# Patient Record
Sex: Male | Born: 1980 | Race: White | Hispanic: No | Marital: Married | Smoking: Former smoker
Health system: Southern US, Community
[De-identification: ages and names within clinical notes are randomized; demographics above are authoritative.]

## PROBLEM LIST (undated history)

## (undated) DIAGNOSIS — N390 Urinary tract infection, site not specified: Secondary | ICD-10-CM

## (undated) DIAGNOSIS — J45909 Unspecified asthma, uncomplicated: Secondary | ICD-10-CM

## (undated) DIAGNOSIS — E119 Type 2 diabetes mellitus without complications: Secondary | ICD-10-CM

## (undated) DIAGNOSIS — I10 Essential (primary) hypertension: Secondary | ICD-10-CM

## (undated) DIAGNOSIS — E785 Hyperlipidemia, unspecified: Secondary | ICD-10-CM

## (undated) HISTORY — DX: Type 2 diabetes mellitus without complications: E11.9

## (undated) HISTORY — DX: Unspecified asthma, uncomplicated: J45.909

## (undated) HISTORY — DX: Essential (primary) hypertension: I10

## (undated) HISTORY — DX: Hyperlipidemia, unspecified: E78.5

---

## 2008-06-18 ENCOUNTER — Ambulatory Visit: Admission: AD | Admit: 2008-06-18 | Discharge: 2008-06-18 | Payer: Self-pay | Admitting: Family Medicine

## 2009-03-06 ENCOUNTER — Encounter: Payer: Self-pay | Admitting: Orthopedic Surgery

## 2009-03-06 ENCOUNTER — Emergency Department (HOSPITAL_COMMUNITY): Admission: EM | Admit: 2009-03-06 | Discharge: 2009-03-06 | Payer: Self-pay | Admitting: Emergency Medicine

## 2009-03-07 ENCOUNTER — Ambulatory Visit: Payer: Self-pay | Admitting: Orthopedic Surgery

## 2009-03-07 DIAGNOSIS — S62009A Unspecified fracture of navicular [scaphoid] bone of unspecified wrist, initial encounter for closed fracture: Secondary | ICD-10-CM | POA: Insufficient documentation

## 2009-03-28 ENCOUNTER — Ambulatory Visit: Payer: Self-pay | Admitting: Orthopedic Surgery
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
## 2010-03-28 NOTE — Letter (Signed)
Summary: Work [facility]  [address]  [place], [place] [address]   Phone: [phone]  Fax: [phone]      Today's Date: March 07, 2009  Name of Patient: Tony Wilson  The above named patient had a medical visit today at: 2:30 pm.  Please take this into consideration when reviewing the time away from work/school.    Special Instructions:   [  ] To be off the remainder of today, returning to the work tomorrow, 03/08/2009, with the following restrictions:     - Please accomodate/adjust work duties accordingly, secondary to cast for the next month (through next scheduled appointment:  04/07/09)      Sincerely,   Terrance Mass, MD

## 2010-03-28 NOTE — Letter (Signed)
Summary: History form  History form   Imported By: Jacklynn Ganong 03/14/2009 13:48:17  _____________________________________________________________________  External Attachment:    Type:   Image     Comment:   External Document

## 2011-06-08 ENCOUNTER — Emergency Department (HOSPITAL_COMMUNITY): Payer: BC Managed Care – PPO

## 2011-06-08 ENCOUNTER — Encounter (HOSPITAL_COMMUNITY): Payer: Self-pay | Admitting: Emergency Medicine

## 2011-06-08 ENCOUNTER — Emergency Department (HOSPITAL_COMMUNITY)
Admission: EM | Admit: 2011-06-08 | Discharge: 2011-06-08 | Disposition: A | Discharge status: Home / Self Care | Payer: BC Managed Care – PPO | Attending: Emergency Medicine | Admitting: Emergency Medicine

## 2011-06-08 DIAGNOSIS — F17200 Nicotine dependence, unspecified, uncomplicated: Secondary | ICD-10-CM | POA: Insufficient documentation

## 2011-06-08 DIAGNOSIS — Z23 Encounter for immunization: Secondary | ICD-10-CM | POA: Insufficient documentation

## 2011-06-08 DIAGNOSIS — R079 Chest pain, unspecified: Secondary | ICD-10-CM | POA: Insufficient documentation

## 2011-06-08 DIAGNOSIS — M25559 Pain in unspecified hip: Secondary | ICD-10-CM | POA: Insufficient documentation

## 2011-06-08 DIAGNOSIS — T07XXXA Unspecified multiple injuries, initial encounter: Secondary | ICD-10-CM

## 2011-06-08 DIAGNOSIS — IMO0002 Reserved for concepts with insufficient information to code with codable children: Secondary | ICD-10-CM | POA: Insufficient documentation

## 2011-06-08 DIAGNOSIS — S7000XA Contusion of unspecified hip, initial encounter: Secondary | ICD-10-CM | POA: Insufficient documentation

## 2011-06-08 DIAGNOSIS — R21 Rash and other nonspecific skin eruption: Secondary | ICD-10-CM | POA: Insufficient documentation

## 2011-06-08 MED ORDER — HYDROCODONE-ACETAMINOPHEN 5-325 MG PO TABS
1.0000 | ORAL_TABLET | Freq: Once | ORAL | Status: AC
  Administered 2011-06-08: 1 via ORAL
  Filled 2011-06-08: qty 1

## 2011-06-08 MED ORDER — TETANUS-DIPHTH-ACELL PERTUSSIS 5-2.5-18.5 LF-MCG/0.5 IM SUSP
0.5000 mL | Freq: Once | INTRAMUSCULAR | Status: AC
  Administered 2011-06-08: 0.5 mL via INTRAMUSCULAR
  Filled 2011-06-08: qty 0.5

## 2011-06-08 MED ORDER — HYDROCODONE-ACETAMINOPHEN 5-325 MG PO TABS: 1.0000 | ORAL_TABLET | Freq: Four times a day (QID) | ORAL | Status: AC | PRN
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
## 2011-06-08 NOTE — ED Notes (Signed)
Pt was seen by md,

## 2011-06-08 NOTE — Discharge Instructions (Signed)
Wound treatment as discussed.wash daily in the shower dress wounds with Neosporin or bacitracin ointment. Recommend taking Motrin or Naprosyn daily and supplement with hydrocodone prescription given for more severe pain. Return for any abdominal pain nausea or vomiting or trouble breathing. X-rays today were all negative.  RESOURCE GUIDE  Dental Problems  Patients with Medicaid: [facility] [phone] [address]                                           [phone] Phone:  [phone]                                                  Phone:  [phone]  If unable to pay or uninsured, contact:  [facility] or [facility] to become qualified for the adult dental clinic.  Chronic Pain Problems Contact [facility]  [phone] Patients need to be referred by their primary care doctor.  Insufficient Money for Medicine Contact [employer] Way:  call "211" or [facility] 8702826930.  No Primary Care Doctor Call [facility]  3122078715 Other agencies that provide inexpensive medical care    [facility]  [phone]    [facility]  [phone]    [facility]  [phone]    [facility]  [phone]    [facility]  [phone]    [facility]  [phone]  Psychological Services [facility]  [phone] [facility]  [phone] [facility]   [phone] (emergency services [phone])  Substance Abuse Resources [facility]  7254865255 [facility] [phone] [facility] [phone] Floydene Flock [phone] Residential & Outpatient Substance Abuse Program  3132952755  Abuse/Neglect [facility] Child Abuse Hotline 506-438-9461 [facility] Child Abuse Hotline [phone] (After Hours)  Emergency Shelter [facility] [phone]  Maternity  Homes Room at the [place] [facility] [place] [phone] [facility] [phone]  MRSA Hotline #:   930-799-5398    [facility]  [facility]     [employer]                          [facility] Dept. [address] [place]                       [address]      [address] [place] [address]  [place]                                                Cristobal Goldmann Phone:  [phone]  Phone:  [phone]                 Phone:  [phone]  [place] [facility] Phone:  [phone]  [place] Child Abuse Hotline (336) 342-1394 [phone] (After Hours)   
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
## 2011-06-14 ENCOUNTER — Other Ambulatory Visit (HOSPITAL_COMMUNITY): Payer: Self-pay | Admitting: Family Medicine

## 2011-06-14 ENCOUNTER — Ambulatory Visit (HOSPITAL_COMMUNITY)
Admission: RE | Admit: 2011-06-14 | Discharge: 2011-06-14 | Disposition: A | Discharge status: Home / Self Care | Payer: BC Managed Care – PPO | Source: Ambulatory Visit | Attending: Family Medicine | Admitting: Family Medicine

## 2011-06-14 DIAGNOSIS — M25539 Pain in unspecified wrist: Secondary | ICD-10-CM | POA: Insufficient documentation

## 2012-06-09 ENCOUNTER — Ambulatory Visit (HOSPITAL_COMMUNITY)
Admission: RE | Admit: 2012-06-09 | Discharge: 2012-06-09 | Disposition: A | Discharge status: Home / Self Care | Payer: 59 | Source: Ambulatory Visit | Attending: Family Medicine | Admitting: Family Medicine

## 2012-06-09 DIAGNOSIS — M545 Low back pain, unspecified: Secondary | ICD-10-CM | POA: Insufficient documentation

## 2012-06-09 DIAGNOSIS — M6281 Muscle weakness (generalized): Secondary | ICD-10-CM | POA: Insufficient documentation

## 2012-06-09 DIAGNOSIS — IMO0001 Reserved for inherently not codable concepts without codable children: Secondary | ICD-10-CM | POA: Insufficient documentation
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
## 2013-12-14 ENCOUNTER — Ambulatory Visit (INDEPENDENT_AMBULATORY_CARE_PROVIDER_SITE_OTHER): Payer: 59 | Admitting: Family Medicine

## 2013-12-14 ENCOUNTER — Ambulatory Visit (INDEPENDENT_AMBULATORY_CARE_PROVIDER_SITE_OTHER): Payer: 59

## 2013-12-14 VITALS — BP 148/90 | HR 78 | Temp 97.7°F | Resp 16 | Ht 73.25 in | Wt 253.0 lb

## 2013-12-14 DIAGNOSIS — M25562 Pain in left knee: Secondary | ICD-10-CM

## 2013-12-14 MED ORDER — MELOXICAM 15 MG PO TABS: 15.0000 mg | ORAL_TABLET | Freq: Every day | ORAL | Status: DC

## 2013-12-14 NOTE — Patient Instructions (Addendum)
You will be called to schedule your MRI and an appointment with ortho. You may take mobic once daily for pain and inflammation Wear the knee brace during the day to help with stabilization.

## 2013-12-14 NOTE — Progress Notes (Signed)
Subjective:    Patient ID: Tony SatoKeith E Salah, male    DOB: Feb 26, 1981, 33 y.o.   MRN: [id number]  Knee Pain  Pertinent negatives include no numbness.    This is a 33 year old male with only PMH lumbago for which he takes vicodin daily. He is here with 5 months of left knee pain that has worsened over the past 2 months. He does not recall an injury. The pain is located to the lateral side. He describes the pain as sharp and radiates halfway down the lateral side of his leg. The pain is worse with flexion and better with extension. The pain is especially bad with going up stairs. He is limping and walking by swinging out his left leg. He has not tried anything for the pain other than his daily vicodin for back pain which has not helped his knee pain at all. 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 He has not ridden motorcycles since this pain started 5 months ago. He denies knee swelling or paresthesias.  Patient's BP is elevated today. He reports he takes his BP regularly at home and usually 120-130/75-85.   Review of Systems  Musculoskeletal: Positive for arthralgias, back pain, gait problem and myalgias. Negative for joint swelling.  Skin: Negative.   Neurological: Positive for weakness. Negative for numbness.       Objective:   Physical Exam  Constitutional: He is oriented to person, place, and time. He appears well-developed and well-nourished. No distress.  HENT:  Head: Normocephalic and atraumatic.  Right Ear: Hearing normal.  Left Ear: Hearing normal.  Eyes: Conjunctivae are normal. Right eye exhibits no discharge. Left eye exhibits no discharge. No scleral icterus.  Cardiovascular: Normal rate, regular rhythm, normal heart sounds and intact distal pulses.   Pulmonary/Chest: Effort normal and breath sounds normal. No respiratory distress.  Musculoskeletal:       Left knee: He exhibits normal range of motion, no effusion, no ecchymosis, no  erythema, no LCL laxity, no bony tenderness and normal meniscus. No medial joint line, no lateral joint line, no MCL, no LCL and no patellar tendon tenderness noted.  Pain at the lateral knee with flexion and internal rotation. No posterior knee pain. McMurray negative. Lachman negative.  Neurological: He is alert and oriented to person, place, and time. He has normal reflexes. No sensory deficit.  Reflex Scores:      Bicep reflexes are 2+ on the right side and 2+ on the left side.      Achilles reflexes are 2+ on the right side and 2+ on the left side. Left knee strength: 4/5 extension, 5/5 flexion Left ankle strength: 4/5 dorsiflexion, 4+/5 plantar flexion Right knee strength: 5/5 throughout Right ankle strength: 5/5 throughout  Skin: Skin is warm, dry and intact. No lesion and no rash noted.  Psychiatric: He has a normal mood and affect. His speech is normal and behavior is normal. Thought content normal.   [facility] reading (PRIMARY) by  Dr. Conley RollsLe: negative.     Assessment & Plan:  1. Knee pain, acute, left  Etiology of knee pain unclear - LCL vs. Hamstring vs. Gastrocnemius. Was unable to elicit any point tenderness. He had exquisite pain with flexion and internal rotation of the knee. Xray of the left knee was negative. Due to this going on for 5 months, his gait being affected, and decreased strength, will send for an MRI of the left knee and a  referral to ortho. He will take mobic daily for pain and inflammation. He was fitted for a hinge knee brace today.   - DG Knee Complete 4 Views Left; Future - MR Knee Left  Wo Contrast; Future - Ambulatory referral to Orthopedic Surgery - meloxicam (MOBIC) 15 MG tablet; Take 1 tablet (15 mg total) by mouth daily.  Dispense: 30 tablet; Refill: 0 - PR KO ELAS W/ CONDYLE PADS & Arie SabinaJO   Montie Swiderski V. Dyke BrackettBush, PA-C, MHS [facility]  12/14/2013

## 2013-12-16 NOTE — Progress Notes (Signed)
A/P d/w Ms Dyke BrackettBush PA-C. I am in agreement with A/P. Dr Conley RollsLe

## 2013-12-17 ENCOUNTER — Other Ambulatory Visit: Payer: Self-pay | Admitting: Family Medicine

## 2013-12-17 DIAGNOSIS — M25562 Pain in left knee: Secondary | ICD-10-CM

## 2013-12-21 ENCOUNTER — Ambulatory Visit (HOSPITAL_COMMUNITY): Payer: 59

## 2013-12-24 ENCOUNTER — Ambulatory Visit (HOSPITAL_COMMUNITY)
Admission: RE | Admit: 2013-12-24 | Discharge: 2013-12-24 | Disposition: A | Discharge status: Home / Self Care | Payer: 59 | Source: Ambulatory Visit | Attending: Family Medicine | Admitting: Family Medicine

## 2013-12-24 DIAGNOSIS — M25462 Effusion, left knee: Secondary | ICD-10-CM | POA: Insufficient documentation

## 2013-12-24 DIAGNOSIS — M94262 Chondromalacia, left knee: Secondary | ICD-10-CM | POA: Insufficient documentation

## 2013-12-24 DIAGNOSIS — M25562 Pain in left knee: Secondary | ICD-10-CM | POA: Insufficient documentation

## 2013-12-28 ENCOUNTER — Encounter: Payer: Self-pay | Admitting: Family Medicine

## 2013-12-28 ENCOUNTER — Telehealth: Payer: Self-pay | Admitting: Family Medicine

## 2013-12-28 NOTE — Telephone Encounter (Signed)
LM about MRI results. He has a referral to Dr Althea CharonMcKinley

## 2014-01-12 ENCOUNTER — Encounter: Payer: Self-pay | Admitting: Orthopedic Surgery

## 2014-01-12 ENCOUNTER — Ambulatory Visit (INDEPENDENT_AMBULATORY_CARE_PROVIDER_SITE_OTHER): Payer: 59 | Admitting: Orthopedic Surgery

## 2014-01-12 VITALS — BP 128/88 | Ht 73.25 in | Wt 253.0 lb

## 2014-01-12 DIAGNOSIS — M25562 Pain in left knee: Secondary | ICD-10-CM | POA: Insufficient documentation

## 2014-01-12 MED ORDER — GABAPENTIN 100 MG PO CAPS: 100.0000 mg | ORAL_CAPSULE | Freq: Three times a day (TID) | ORAL | Status: AC

## 2014-01-12 NOTE — Progress Notes (Signed)
Patient ID: Tony SatoKeith E Reppond, male   DOB: 1980-06-25, 33 y.o.   MRN: [id number] Chief Complaint  Patient presents with  . Knee Pain    left knee pain, no known injury    33 year old male presents with atypical knee pain. He's had an x-ray and an MRI. I looked at his MRI had some fluid deep to the iliotibial band which was read as possible related to iliotibial band syndrome. 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  His symptoms are not typical for iliotibial band syndrome  He said he was coming down some steps with a bag of concrete felt acute pain couldn't bend his knee.  His pain is lateral unrelated to his patellofemoral joint in any way.  He has a history of chronic degenerative disc disease was seen by neurosurgery several years ago surgery was not done at that time  His complaints now are stiffness giving out aching lateral posterior thigh distally and posterior knee and proximal lateral tib-fib area. He rates it 5-6 out of 10 and says his medication for this was meloxicam and he stopped taking that because of the side effects listed on the bottle.  He has no allergies. His family history is negative. He does not use drugs drink or smoke  There is no medical history listed for him.  BP 128/88 mmHg  Ht 6' 1.25" (1.861 m)  Wt 253 lb (114.76 kg)  BMI 33.14 kg/m2  His appearance is normal He is oriented 3 His mood and affect is normal He walks normally Right knee no tenderness full range of motion ligament stable motor exam normal skin intact normal sensation  Left knee no effusion no tenderness ligament stable motor exam normal skin intact good pulses normal sensation negative McMurray sign  He is tender in the posterior lateral distal thigh and this can be  palpated along the peroneal nerve  He has a typical knee pain I think his problems are related to either his lumbar spine or some type of radicular neuralgia  I recommend he wear knee brace in case there is any problems related to his chondromalacia and take gabapentin 100 mg 3 times a day #60 and follow-up with his primary care physician

## 2014-01-25 ENCOUNTER — Other Ambulatory Visit (HOSPITAL_COMMUNITY): Payer: Self-pay | Admitting: Physician Assistant

## 2014-01-25 DIAGNOSIS — M5136 Other intervertebral disc degeneration, lumbar region: Secondary | ICD-10-CM

## 2014-01-25 DIAGNOSIS — M5417 Radiculopathy, lumbosacral region: Secondary | ICD-10-CM

## 2014-01-25 DIAGNOSIS — G894 Chronic pain syndrome: Secondary | ICD-10-CM

## 2014-01-28 ENCOUNTER — Ambulatory Visit (HOSPITAL_COMMUNITY)
Admission: RE | Admit: 2014-01-28 | Discharge: 2014-01-28 | Disposition: A | Discharge status: Home / Self Care | Payer: 59 | Source: Ambulatory Visit | Attending: Physician Assistant | Admitting: Physician Assistant

## 2014-01-28 DIAGNOSIS — G894 Chronic pain syndrome: Secondary | ICD-10-CM

## 2014-01-28 DIAGNOSIS — M25552 Pain in left hip: Secondary | ICD-10-CM | POA: Diagnosis not present

## 2014-01-28 DIAGNOSIS — M545 Low back pain: Secondary | ICD-10-CM | POA: Diagnosis present

## 2014-01-28 DIAGNOSIS — M479 Spondylosis, unspecified: Secondary | ICD-10-CM | POA: Diagnosis not present

## 2014-01-28 DIAGNOSIS — M5417 Radiculopathy, lumbosacral region: Secondary | ICD-10-CM

## 2014-01-28 DIAGNOSIS — M5136 Other intervertebral disc degeneration, lumbar region: Secondary | ICD-10-CM | POA: Insufficient documentation

## 2015-03-16 MED FILL — HYDROCODON-APAP 10-325: 10-325 | 25 days supply | Qty: 100 | Fill #0

## 2015-03-16 MED FILL — diazePAM 10 MG TABS: 10 | 25 days supply | Qty: 100 | Fill #1

## 2015-04-20 DIAGNOSIS — A599 Trichomoniasis, unspecified: Secondary | ICD-10-CM | POA: Diagnosis not present

## 2015-04-20 DIAGNOSIS — Z6832 Body mass index (BMI) 32.0-32.9, adult: Secondary | ICD-10-CM | POA: Diagnosis not present

## 2015-04-20 DIAGNOSIS — G894 Chronic pain syndrome: Secondary | ICD-10-CM | POA: Diagnosis not present

## 2015-04-20 DIAGNOSIS — E6609 Other obesity due to excess calories: Secondary | ICD-10-CM | POA: Diagnosis not present

## 2015-04-20 DIAGNOSIS — F419 Anxiety disorder, unspecified: Secondary | ICD-10-CM | POA: Diagnosis not present

## 2015-04-20 DIAGNOSIS — Z1389 Encounter for screening for other disorder: Secondary | ICD-10-CM | POA: Diagnosis not present

## 2015-04-20 MED FILL — metroNIDAZOLE 500 MG TABS: 500 | 1 days supply | Qty: 4 | Fill #0

## 2015-04-21 MED FILL — diazePAM 10 MG TABS: 10 | 25 days supply | Qty: 100 | Fill #2

## 2015-04-21 MED FILL — HYDROCODON-APAP 10-325: 10-325 | 25 days supply | Qty: 100 | Fill #0

## 2015-05-31 MED FILL — HYDROCODON-APAP 10-325: 10-325 | 30 days supply | Qty: 100 | Fill #0

## 2015-06-23 MED FILL — GABAPENTIN 100 MG CAPSULE: 100 | 90 days supply | Qty: 270 | Fill #1

## 2015-07-29 MED FILL — diazePAM 10 MG TABS: 10 | 25 days supply | Qty: 100 | Fill #0

## 2015-07-29 MED FILL — HYDROCODON-APAP 10-325: 10-325 | 30 days supply | Qty: 120 | Fill #0

## 2015-08-01 DIAGNOSIS — H52223 Regular astigmatism, bilateral: Secondary | ICD-10-CM | POA: Diagnosis not present

## 2015-08-01 DIAGNOSIS — H5213 Myopia, bilateral: Secondary | ICD-10-CM | POA: Diagnosis not present

## 2015-08-03 DIAGNOSIS — F419 Anxiety disorder, unspecified: Secondary | ICD-10-CM | POA: Diagnosis not present

## 2015-08-03 DIAGNOSIS — Z1389 Encounter for screening for other disorder: Secondary | ICD-10-CM | POA: Diagnosis not present

## 2015-08-03 DIAGNOSIS — G894 Chronic pain syndrome: Secondary | ICD-10-CM | POA: Diagnosis not present

## 2015-08-03 DIAGNOSIS — Z6832 Body mass index (BMI) 32.0-32.9, adult: Secondary | ICD-10-CM | POA: Diagnosis not present

## 2015-08-03 DIAGNOSIS — L989 Disorder of the skin and subcutaneous tissue, unspecified: Secondary | ICD-10-CM | POA: Diagnosis not present

## 2015-08-03 MED FILL — CHANTIX STARTING MONTH BOX: 0.5 MG X 11 | 28 days supply | Qty: 53 | Fill #0

## 2015-08-31 MED FILL — HYDROCODON-APAP 10-325: 10-325 | 30 days supply | Qty: 120 | Fill #0

## 2015-09-05 MED FILL — diazePAM 10 MG TABS: 10 | 25 days supply | Qty: 100 | Fill #1

## 2015-09-16 DIAGNOSIS — H6502 Acute serous otitis media, left ear: Secondary | ICD-10-CM | POA: Diagnosis not present

## 2015-09-16 DIAGNOSIS — E782 Mixed hyperlipidemia: Secondary | ICD-10-CM | POA: Diagnosis not present

## 2015-09-16 DIAGNOSIS — Z6832 Body mass index (BMI) 32.0-32.9, adult: Secondary | ICD-10-CM | POA: Diagnosis not present

## 2015-09-16 DIAGNOSIS — Z1389 Encounter for screening for other disorder: Secondary | ICD-10-CM | POA: Diagnosis not present

## 2015-09-16 DIAGNOSIS — I10 Essential (primary) hypertension: Secondary | ICD-10-CM | POA: Diagnosis not present

## 2015-09-16 MED FILL — CHANTIX 1 MG TABLET: 1 | 30 days supply | Qty: 60 | Fill #0

## 2015-09-29 MED FILL — HYDROCODON-APAP 10-325: 10-325 | 30 days supply | Qty: 120 | Fill #0

## 2015-10-13 MED FILL — diazePAM 10 MG TABS: 10 | 25 days supply | Qty: 100 | Fill #2

## 2015-10-17 MED FILL — CHANTIX 1 MG TABLET: 1 | 30 days supply | Qty: 60 | Fill #1

## 2015-10-28 MED FILL — HYDROCODON-APAP 10-325: 10-325 | 30 days supply | Qty: 120 | Fill #0

## 2015-10-28 MED FILL — GABAPENTIN 100 MG CAPSULE: 100 | 90 days supply | Qty: 270 | Fill #2

## 2015-11-03 ENCOUNTER — Ambulatory Visit (INDEPENDENT_AMBULATORY_CARE_PROVIDER_SITE_OTHER): Payer: 59 | Admitting: Otolaryngology

## 2015-11-03 DIAGNOSIS — H9042 Sensorineural hearing loss, unilateral, left ear, with unrestricted hearing on the contralateral side: Secondary | ICD-10-CM | POA: Diagnosis not present

## 2015-11-03 DIAGNOSIS — H9222 Otorrhagia, left ear: Secondary | ICD-10-CM

## 2015-11-03 MED FILL — predniSONE 10 MG TABS: 10 | 6 days supply | Qty: 21 | Fill #0

## 2015-11-03 MED FILL — FLUTICASONE PROP 50 MCG SPR: 50 | 30 days supply | Qty: 16 | Fill #0

## 2015-11-10 ENCOUNTER — Ambulatory Visit (INDEPENDENT_AMBULATORY_CARE_PROVIDER_SITE_OTHER): Payer: 59 | Admitting: Otolaryngology

## 2015-11-10 DIAGNOSIS — H9222 Otorrhagia, left ear: Secondary | ICD-10-CM | POA: Diagnosis not present

## 2015-11-10 DIAGNOSIS — H9042 Sensorineural hearing loss, unilateral, left ear, with unrestricted hearing on the contralateral side: Secondary | ICD-10-CM | POA: Diagnosis not present

## 2015-11-10 MED FILL — predniSONE 10 MG TABS: 10 | 12 days supply | Qty: 42 | Fill #0

## 2015-11-24 ENCOUNTER — Ambulatory Visit (INDEPENDENT_AMBULATORY_CARE_PROVIDER_SITE_OTHER): Payer: 59 | Admitting: Otolaryngology

## 2015-11-24 DIAGNOSIS — H9042 Sensorineural hearing loss, unilateral, left ear, with unrestricted hearing on the contralateral side: Secondary | ICD-10-CM

## 2015-11-24 DIAGNOSIS — H9222 Otorrhagia, left ear: Secondary | ICD-10-CM

## 2015-11-24 MED FILL — CHANTIX 1 MG TABLET: 1 | 30 days supply | Qty: 60 | Fill #0

## 2015-11-25 MED FILL — HYDROCODON-APAP 10-325: 10-325 | 30 days supply | Qty: 120 | Fill #0

## 2015-12-12 DIAGNOSIS — F419 Anxiety disorder, unspecified: Secondary | ICD-10-CM | POA: Diagnosis not present

## 2015-12-12 DIAGNOSIS — Z1389 Encounter for screening for other disorder: Secondary | ICD-10-CM | POA: Diagnosis not present

## 2015-12-12 DIAGNOSIS — M47816 Spondylosis without myelopathy or radiculopathy, lumbar region: Secondary | ICD-10-CM | POA: Diagnosis not present

## 2015-12-12 DIAGNOSIS — I10 Essential (primary) hypertension: Secondary | ICD-10-CM | POA: Diagnosis not present

## 2015-12-12 DIAGNOSIS — Z6832 Body mass index (BMI) 32.0-32.9, adult: Secondary | ICD-10-CM | POA: Diagnosis not present

## 2016-01-02 MED FILL — HYDROCODON-APAP 10-325: 10-325 | 30 days supply | Qty: 120 | Fill #0

## 2016-01-30 MED FILL — HYDROCODON-APAP 10-325: 10-325 | 30 days supply | Qty: 120 | Fill #0

## 2016-01-30 MED FILL — CHANTIX 1 MG TABLET: 1 | 30 days supply | Qty: 60 | Fill #1

## 2016-01-30 MED FILL — diazePAM 10 MG TABS: 10 | 25 days supply | Qty: 100 | Fill #0

## 2016-02-15 DIAGNOSIS — Z6833 Body mass index (BMI) 33.0-33.9, adult: Secondary | ICD-10-CM | POA: Diagnosis not present

## 2016-02-15 DIAGNOSIS — Z1389 Encounter for screening for other disorder: Secondary | ICD-10-CM | POA: Diagnosis not present

## 2016-02-15 DIAGNOSIS — M25512 Pain in left shoulder: Secondary | ICD-10-CM | POA: Diagnosis not present

## 2016-02-15 DIAGNOSIS — E782 Mixed hyperlipidemia: Secondary | ICD-10-CM | POA: Diagnosis not present

## 2016-02-15 DIAGNOSIS — I10 Essential (primary) hypertension: Secondary | ICD-10-CM | POA: Diagnosis not present

## 2016-02-15 DIAGNOSIS — E6609 Other obesity due to excess calories: Secondary | ICD-10-CM | POA: Diagnosis not present

## 2016-02-15 DIAGNOSIS — M719 Bursopathy, unspecified: Secondary | ICD-10-CM | POA: Diagnosis not present

## 2016-02-15 DIAGNOSIS — G894 Chronic pain syndrome: Secondary | ICD-10-CM | POA: Diagnosis not present

## 2016-02-15 MED FILL — METHYLPREDNISOLONE 4 MG TAB: 4 | 6 days supply | Qty: 21 | Fill #0

## 2016-03-15 MED FILL — HYDROCODON-APAP 10-325: 10-325 | 30 days supply | Qty: 120 | Fill #0

## 2016-03-15 MED FILL — diazePAM 10 MG TABS: 10 | 25 days supply | Qty: 100 | Fill #0

## 2016-03-29 ENCOUNTER — Encounter: Payer: Self-pay | Admitting: Orthopaedic Surgery

## 2016-03-29 ENCOUNTER — Ambulatory Visit (INDEPENDENT_AMBULATORY_CARE_PROVIDER_SITE_OTHER): Payer: 59

## 2016-03-29 ENCOUNTER — Ambulatory Visit (INDEPENDENT_AMBULATORY_CARE_PROVIDER_SITE_OTHER): Payer: 59 | Admitting: Orthopaedic Surgery

## 2016-03-29 VITALS — BP 147/89 | HR 80 | Temp 98.1°F | Ht 74.0 in | Wt 253.0 lb

## 2016-03-29 DIAGNOSIS — M25512 Pain in left shoulder: Secondary | ICD-10-CM

## 2016-03-29 NOTE — Progress Notes (Signed)
Patient Tony Wilson, male DOB:1981-02-21, 36 y.o. JYN:[id number]  Chief Complaint  Patient presents with  . New Problem    Left Shoulder Pain    HPI  Tony Wilson is a 36 y.o. male who has had left shoulder pain since Thanksgiving.  He works as Curator for [employer].  He does not have a specific traumatic event.  It has been getting worse over time.  He was seen at [facility] on 02-16-16 and was given prednisone which helped until after the first of the new year.  He has pain again in the left shoulder, more with overhead use.  He has pain at night rolling on it sleeping.  He has no redness, no swelling, no numbness.  He has tried ice which helps and Advil but the Advil is not helping that much now.  He has to use his arms at work and by the end of the day he is hurting.  He is tired of hurting.   HPI  Body mass index is 32.48 kg/m.  ROS  Review of Systems  HENT: Positive for congestion.   Respiratory: Negative for cough and shortness of breath.   Cardiovascular: Negative for chest pain and leg swelling.  Endocrine: Negative for cold intolerance.  Musculoskeletal: Positive for arthralgias.  Allergic/Immunologic: Negative for environmental allergies.    History reviewed. No pertinent past medical history.  History reviewed. No pertinent surgical history.  Family History  Problem Relation Age of Onset  . Cancer Mother     Social History Social History  Substance Use Topics  . Smoking status: Former Smoker    Types: Cigarettes  . Smokeless tobacco: Never Used  . Alcohol use No    No Known Allergies  Current Outpatient Prescriptions  Medication Sig Dispense Refill  . gabapentin (NEURONTIN) 100 MG capsule Take 1 capsule (100 mg total) by mouth 3 (three) times daily. 60 capsule 0  . HYDROcodone-acetaminophen (NORCO) 10-325 MG per tablet Take 1 tablet by mouth every morning.    . meloxicam (MOBIC) 15 MG tablet Take 1 tablet (15 mg total) by mouth daily.  (Patient not taking: Reported on 03/29/2016) 30 tablet 0   No current facility-administered medications for this visit.      Physical Exam  Blood pressure (!) 147/89, pulse 80, temperature 98.1 F (36.7 C), height 6\' 2"  (1.88 m), weight 253 lb (114.8 kg).  Constitutional: overall normal hygiene, normal nutrition, well developed, normal grooming, normal body habitus. Assistive device:none  Musculoskeletal: gait and station Limp none, muscle tone and strength are normal, no tremors or atrophy is present.  .  Neurological: coordination overall normal.  Deep tendon reflex/nerve stretch intact.  Sensation normal.  Cranial nerves II-XII intact.   Skin:   Normal overall no scars, lesions, ulcers or rashes. No psoriasis.  Psychiatric: Alert and oriented x 3.  Recent memory intact, remote memory unclear.  Normal mood and affect. Well groomed.  Good eye contact.  Cardiovascular: overall no swelling, no varicosities, no edema bilaterally, normal temperatures of the legs and arms, no clubbing, cyanosis and good capillary refill.  Lymphatic: palpation is normal.  Examination of left Upper Extremity is done.  Inspection:   Overall:  Elbow non-tender without crepitus or defects, forearm non-tender without crepitus or defects, wrist non-tender without crepitus or defects, hand non-tender.    Shoulder: with glenohumeral joint tenderness, without effusion.   Upper arm: without swelling and tenderness   Range of motion:   Overall:  Full range of motion  of the elbow, full range of motion of wrist and full range of motion in fingers.   Shoulder:  left  145 degrees forward flexion; 100 degrees abduction; 30 degrees internal rotation, 30 degrees external rotation, 15 degrees extension, 40 degrees adduction.   Stability:   Overall:  Shoulder, elbow and wrist stable   Strength and Tone:   Overall full shoulder muscles strength, full upper arm strength and normal upper arm bulk and tone.   The patient  has been educated about the nature of the problem(s) and counseled on treatment options.  The patient appeared to understand what I have discussed and is in agreement with it.  Encounter Diagnosis  Name Primary?  . Acute pain of left shoulder Yes   PROCEDURE NOTE:  The patient request injection, verbal consent was obtained.  The left shoulder was prepped appropriately after time out was performed.   Sterile technique was observed and injection of 1 cc of Depo-Medrol 40 mg with several cc's of plain xylocaine. Anesthesia was provided by ethyl chloride and a 20-gauge needle was used to inject the shoulder area. A posterior approach was used.  The injection was tolerated well.  A band aid dressing was applied.  The patient was advised to apply ice later today and tomorrow to the injection sight as needed.   PLAN Call if any problems.  Precautions discussed.  Continue current medications.   Return to clinic after MRI of the left shoulder.   Electronically Signed Darreld McleanWayne Ameka Krigbaum, MD 2/1/20189:27 AM

## 2016-03-29 NOTE — Patient Instructions (Signed)
You have a MRI ordered we will contact your insurance company and get approval then we will call you with an appointment.

## 2016-04-02 MED FILL — GABAPENTIN 100 MG CAPSULE: 100 | 30 days supply | Qty: 90 | Fill #0

## 2016-04-03 ENCOUNTER — Ambulatory Visit (HOSPITAL_COMMUNITY)
Admission: RE | Admit: 2016-04-03 | Discharge: 2016-04-03 | Disposition: A | Discharge status: Home / Self Care | Payer: 59 | Source: Ambulatory Visit | Attending: Orthopaedic Surgery | Admitting: Orthopaedic Surgery

## 2016-04-03 DIAGNOSIS — M25512 Pain in left shoulder: Secondary | ICD-10-CM

## 2016-04-03 DIAGNOSIS — M19012 Primary osteoarthritis, left shoulder: Secondary | ICD-10-CM | POA: Insufficient documentation

## 2016-04-04 ENCOUNTER — Encounter: Payer: Self-pay | Admitting: Orthopaedic Surgery

## 2016-04-04 ENCOUNTER — Encounter (INDEPENDENT_AMBULATORY_CARE_PROVIDER_SITE_OTHER): Payer: 59 | Admitting: Orthopaedic Surgery

## 2016-04-05 NOTE — Progress Notes (Signed)
This encounter was created in error - please disregard.

## 2016-04-26 ENCOUNTER — Encounter: Payer: Self-pay | Admitting: Orthopaedic Surgery

## 2016-04-26 ENCOUNTER — Ambulatory Visit (INDEPENDENT_AMBULATORY_CARE_PROVIDER_SITE_OTHER): Payer: 59 | Admitting: Orthopaedic Surgery

## 2016-04-26 VITALS — BP 152/94 | HR 76 | Temp 97.7°F | Ht 74.0 in | Wt 257.0 lb

## 2016-04-26 DIAGNOSIS — M25512 Pain in left shoulder: Secondary | ICD-10-CM | POA: Diagnosis not present

## 2016-04-26 MED ORDER — PREDNISONE 5 MG (21) PO TBPK: ORAL_TABLET | ORAL | 0 refills | Status: DC

## 2016-04-26 MED ORDER — DICLOFENAC SODIUM 75 MG PO TBEC: 75.0000 mg | DELAYED_RELEASE_TABLET | Freq: Two times a day (BID) | ORAL | 2 refills | Status: DC

## 2016-04-26 MED ORDER — OXYCODONE-ACETAMINOPHEN 7.5-325 MG PO TABS: 1.0000 | ORAL_TABLET | Freq: Four times a day (QID) | ORAL | 0 refills | Status: DC | PRN

## 2016-04-26 MED FILL — predniSONE 5 MG TABS: 5 | 6 days supply | Qty: 21 | Fill #0

## 2016-04-26 MED FILL — DICLOFENAC SOD 75 MG TAB EC: 75 | 30 days supply | Qty: 60 | Fill #0

## 2016-04-26 NOTE — Progress Notes (Signed)
Patient ZO:XWRUE:Tony Wilson, male DOB:03-16-1980, 36 y.o. AVW:[id number]  Chief Complaint  Patient presents with  . Follow-up    MRI review left shoudler    HPI  Tony Wilson is a 36 y.o. male who has had left shoulder pain that is severe at times.  He has no new trauma. He had MRI done showing: IMPRESSION: Mild acromioclavicular osteoarthritis. The examination is otherwise Negative.  I have explained the findings to him.  I want to begin PT for him.  I am also concerned about his neck as the way he describes the pain is often associated not with movement of his shoulder but of his neck moving to the left. HPI  Body mass index is 33 kg/m.  ROS  Review of Systems  HENT: Positive for congestion.   Respiratory: Negative for cough and shortness of breath.   Cardiovascular: Negative for chest pain and leg swelling.  Endocrine: Negative for cold intolerance.  Musculoskeletal: Positive for arthralgias.  Allergic/Immunologic: Negative for environmental allergies.    No past medical history on file.  No past surgical history on file.  Family History  Problem Relation Age of Onset  . Cancer Mother     Social History Social History  Substance Use Topics  . Smoking status: Former Smoker    Types: Cigarettes  . Smokeless tobacco: Never Used  . Alcohol use No    No Known Allergies  Current Outpatient Prescriptions  Medication Sig Dispense Refill  . diclofenac (VOLTAREN) 75 MG EC tablet Take 1 tablet (75 mg total) by mouth 2 (two) times daily with a meal. 60 tablet 2  . gabapentin (NEURONTIN) 100 MG capsule Take 1 capsule (100 mg total) by mouth 3 (three) times daily. 60 capsule 0  . meloxicam (MOBIC) 15 MG tablet Take 1 tablet (15 mg total) by mouth daily. (Patient not taking: Reported on 03/29/2016) 30 tablet 0  . oxyCODONE-acetaminophen (PERCOCET) 7.5-325 MG tablet Take 1 tablet by mouth every 6 (six) hours as needed for moderate pain or severe pain (Must last 14 days.Do  not take and drive a car or operate machinery). 56 tablet 0  . predniSONE (STERAPRED UNI-PAK 21 TAB) 5 MG (21) TBPK tablet Take 6 pills first day; 5 pills second day; 4 pills third day; 3 pills fourth day; 2 pills next day and 1 pill last day. 21 tablet 0   No current facility-administered medications for this visit.      Physical Exam  Blood pressure (!) 152/94, pulse 76, temperature 97.7 F (36.5 C), height 6\' 2"  (1.88 m), weight 257 lb (116.6 kg).  Constitutional: overall normal hygiene, normal nutrition, well developed, normal grooming, normal body habitus. Assistive device:none  Musculoskeletal: gait and station Limp none, muscle tone and strength are normal, no tremors or atrophy is present.  .  Neurological: coordination overall normal.  Deep tendon reflex/nerve stretch intact.  Sensation normal.  Cranial nerves II-XII intact.   Skin:   Normal overall no scars, lesions, ulcers or rashes. No psoriasis.  Psychiatric: Alert and oriented x 3.  Recent memory intact, remote memory unclear.  Normal mood and affect. Well groomed.  Good eye contact.  Cardiovascular: overall no swelling, no varicosities, no edema bilaterally, normal temperatures of the legs and arms, no clubbing, cyanosis and good capillary refill.  Lymphatic: palpation is normal.  His shoulder motion is improved today and he has no paresthesias.  He has pain past abduction of 100 and with resisted abduction.  NV intact.  Grip is  normal.  The patient has been educated about the nature of the problem(s) and counseled on treatment options.  The patient appeared to understand what I have discussed and is in agreement with it.  Encounter Diagnosis  Name Primary?  . Acute pain of left shoulder Yes    PLAN Call if any problems.  Precautions discussed.  Continue current medications.   Return to clinic Begin PT.  Return in three weeks.   I will begin prednisone dose pack, after this is completed begin diclofenac.  He  is to stop the Aleve as it has not helped.  I will give pain medicine.  I have reviewed the [place] Controlled Substance Reporting System web site prior to prescribing narcotic medicine for this patient.  Electronically Signed Darreld Mclean, MD 3/1/20184:20 PM

## 2016-04-30 MED FILL — OXYCODONE-APAP 7.5-325 MG: 7.5-325 | 14 days supply | Qty: 56 | Fill #0

## 2016-05-17 ENCOUNTER — Ambulatory Visit: Payer: 59 | Admitting: Orthopaedic Surgery

## 2016-05-21 MED FILL — GABAPENTIN 100 MG CAPSULE: 100 | 30 days supply | Qty: 90 | Fill #1

## 2016-05-21 MED FILL — DICLOFENAC SOD 75 MG TAB EC: 75 | 30 days supply | Qty: 60 | Fill #1

## 2016-06-01 ENCOUNTER — Emergency Department (HOSPITAL_COMMUNITY)
Admission: EM | Admit: 2016-06-01 | Discharge: 2016-06-01 | Disposition: A | Discharge status: Home / Self Care | Payer: 59 | Attending: Emergency Medicine | Admitting: Emergency Medicine

## 2016-06-01 ENCOUNTER — Encounter (HOSPITAL_COMMUNITY): Payer: Self-pay | Admitting: *Deleted

## 2016-06-01 DIAGNOSIS — R319 Hematuria, unspecified: Secondary | ICD-10-CM

## 2016-06-01 DIAGNOSIS — Z87891 Personal history of nicotine dependence: Secondary | ICD-10-CM | POA: Diagnosis not present

## 2016-06-01 DIAGNOSIS — N3001 Acute cystitis with hematuria: Secondary | ICD-10-CM | POA: Insufficient documentation

## 2016-06-01 LAB — URINALYSIS, ROUTINE W REFLEX MICROSCOPIC
Bilirubin Urine: NEGATIVE
GLUCOSE, UA: NEGATIVE mg/dL
Ketones, ur: NEGATIVE mg/dL
Leukocytes, UA: NEGATIVE
NITRITE: NEGATIVE
PROTEIN: 100 mg/dL — AB
SPECIFIC GRAVITY, URINE: 1.029 (ref 1.005–1.030)
Squamous Epithelial / LPF: NONE SEEN
pH: 5 (ref 5.0–8.0)

## 2016-06-01 MED ORDER — CIPROFLOXACIN HCL 250 MG PO TABS
500.0000 mg | ORAL_TABLET | Freq: Once | ORAL | Status: AC
  Administered 2016-06-01: 500 mg via ORAL
  Filled 2016-06-01: qty 2

## 2016-06-01 MED ORDER — CIPROFLOXACIN HCL 500 MG PO TABS: 500.0000 mg | ORAL_TABLET | Freq: Two times a day (BID) | ORAL | 0 refills | Status: DC

## 2016-06-01 NOTE — ED Provider Notes (Signed)
[facility]-EMERGENCY DEPT Provider Note   CSN: [id number] Arrival date & time: 06/01/16  2136   By signing my name below, I, [name], attest that this documentation has been prepared under the direction and in the presence of [name], MD. Electronically Signed: [name], Scribe. 06/01/16. 10:08 PM. History   Chief Complaint Chief Complaint  Patient presents with  . Hematuria    The history is provided by the patient. No language interpreter was used.  HPI Comments: Tony Wilson is a 36 y.o. male who presents to the Emergency Department complaining of urinary frequency, dysuria, and hematuria for the past few days. The patient states that his urinary frequency worsened today. He also reports some pain to the tip of his penis. He is sexually active. The patient denies having any unprotected sex with anyone other than his wife. He states that his wife has not been having unprotected sex with anyone other than himself to his knownlege. He denies hx of kidney stones. He denies penile drainage, rectal pain, rashes, and flank pain.  History reviewed. No pertinent past medical history.  Patient Active Problem List   Diagnosis Date Noted  . Left knee pain 01/12/2014  . Lumbago 06/09/2012  . CLOSED FRACTURE OF NAVICULAR BONE OF WRIST 03/07/2009    History reviewed. No pertinent surgical history.     Home Medications    Prior to Admission medications   Medication Sig Start Date End Date Taking? Authorizing Provider  ciprofloxacin (CIPRO) 500 MG tablet Take 1 tablet (500 mg total) by mouth 2 (two) times daily. 06/01/16   [name], MD  diclofenac (VOLTAREN) 75 MG EC tablet Take 1 tablet (75 mg total) by mouth 2 (two) times daily with a meal. 04/26/16   Darreld Mclean, MD  gabapentin (NEURONTIN) 100 MG capsule Take 1 capsule (100 mg total) by mouth 3 (three) times daily. 01/12/14   Vickki Hearing, MD  meloxicam (MOBIC) 15 MG tablet Take 1 tablet (15 mg total) by mouth  daily. Patient not taking: Reported on 03/29/2016 12/14/13   Dorna Leitz, PA-C  oxyCODONE-acetaminophen (PERCOCET) 7.5-325 MG tablet Take 1 tablet by mouth every 6 (six) hours as needed for moderate pain or severe pain (Must last 14 days.Do not take and drive a car or operate machinery). 04/26/16   Darreld Mclean, MD  predniSONE (STERAPRED UNI-PAK 21 TAB) 5 MG (21) TBPK tablet Take 6 pills first day; 5 pills second day; 4 pills third day; 3 pills fourth day; 2 pills next day and 1 pill last day. 04/26/16   Darreld Mclean, MD    Family History Family History  Problem Relation Age of Onset  . Cancer Mother     Social History Social History  Substance Use Topics  . Smoking status: Former Smoker    Types: Cigarettes  . Smokeless tobacco: Never Used  . Alcohol use No     Allergies   Penicillins   Review of Systems Review of Systems  Constitutional: Negative for fever.  Respiratory: Negative for cough.   Gastrointestinal: Negative for abdominal pain, nausea and vomiting.  Genitourinary: Positive for frequency, hematuria and penile pain (Tip of penis). Negative for discharge and flank pain.  Musculoskeletal: Negative for back pain.     Physical Exam Updated Vital Signs BP (!) 145/94 (BP Location: Right Arm)   Pulse 85   Temp 98 F (36.7 C) (Oral)   Resp 20   Ht  (1.88 m)   Wt 245 lb (111.1 kg)  SpO2 100%   BMI 31.46 kg/m   Physical Exam  Constitutional: He is oriented to person, place, and time. He appears well-developed and well-nourished.  HENT:  Head: Normocephalic.  Eyes: EOM are normal.  Neck: Normal range of motion.  Pulmonary/Chest: Effort normal.  Abdominal: He exhibits no distension.  Genitourinary:  Genitourinary Comments: Penis is normal. No redness, rashes, or drainage.  Musculoskeletal: Normal range of motion.  Neurological: He is alert and oriented to person, place, and time.  Psychiatric: He has a normal mood and affect.  Nursing note and vitals  reviewed.  ED Treatments / Results  DIAGNOSTIC STUDIES: Oxygen Saturation is 100% on RA, normal by my interpretation.    COORDINATION OF CARE: 10:00 PM Discussed treatment plan with pt at bedside and pt agreed to plan. I discussed with the different reasons the patient may be experiencing blood in his urine. I will check his labs.  Labs (all labs ordered are listed, but only abnormal results are displayed) Labs Reviewed  URINALYSIS, ROUTINE W REFLEX MICROSCOPIC - Abnormal; Notable for the following:       Result Value   APPearance HAZY (*)    Hgb urine dipstick LARGE (*)    Protein, ur 100 (*)    Bacteria, UA RARE (*)    All other components within normal limits  URINE CULTURE    EKG  EKG Interpretation None       Radiology No results found.  Procedures Procedures (including critical care time)  Medications Ordered in ED Medications  ciprofloxacin (CIPRO) tablet 500 mg (500 mg Oral Given 06/01/16 2246)     Initial Impression / Assessment and Plan / ED Course  I have reviewed the triage vital signs and the nursing notes.  Pertinent labs & imaging results that were available during my care of the patient were reviewed by me and considered in my medical decision making (see chart for details).     Likely UTI. Less likely prostatitis. Urine cx added on.  No back/abdominal pain to suggest kidney stone.  No discharge/exposure to suggest STD.  abx started Will fu w/ pcp in a week for recheck of urine to ensure blood clears.   Final Clinical Impressions(s) / ED Diagnoses   Final diagnoses:  Hematuria, unspecified type  Acute cystitis with hematuria    New Prescriptions Discharge Medication List as of 06/01/2016 10:37 PM    START taking these medications   Details  ciprofloxacin (CIPRO) 500 MG tablet Take 1 tablet (500 mg total) by mouth 2 (two) times daily., Starting Fri 06/01/2016, Print       I personally performed the services described in this  documentation, which was scribed in my presence. The recorded information has been reviewed and is accurate.    [name], MD 06/01/16 [phone]

## 2016-06-01 NOTE — ED Notes (Signed)
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

## 2016-06-01 NOTE — ED Triage Notes (Addendum)
Pt reports urinary frequency, dysuria, hematuria, and pain to tip of his penis. Pt denies any back or flank pain. Pt denies having any unprotected sex with anyone other than his wife. Pt denies penile discharge.

## 2016-06-03 LAB — URINE CULTURE: Culture: NO GROWTH

## 2016-06-04 MED FILL — HYDROCODON-APAP 10-325: 10-325 | 30 days supply | Qty: 120 | Fill #0

## 2016-06-20 ENCOUNTER — Encounter (HOSPITAL_COMMUNITY): Payer: Self-pay

## 2016-06-20 ENCOUNTER — Emergency Department (HOSPITAL_COMMUNITY): Payer: 59

## 2016-06-20 ENCOUNTER — Emergency Department (HOSPITAL_COMMUNITY)
Admission: EM | Admit: 2016-06-20 | Discharge: 2016-06-20 | Disposition: A | Discharge status: Home / Self Care | Payer: 59 | Attending: Emergency Medicine | Admitting: Emergency Medicine

## 2016-06-20 DIAGNOSIS — N201 Calculus of ureter: Secondary | ICD-10-CM | POA: Diagnosis not present

## 2016-06-20 DIAGNOSIS — Z87891 Personal history of nicotine dependence: Secondary | ICD-10-CM | POA: Diagnosis not present

## 2016-06-20 DIAGNOSIS — Z79899 Other long term (current) drug therapy: Secondary | ICD-10-CM | POA: Insufficient documentation

## 2016-06-20 DIAGNOSIS — R1031 Right lower quadrant pain: Secondary | ICD-10-CM | POA: Diagnosis present

## 2016-06-20 HISTORY — DX: Urinary tract infection, site not specified: N39.0

## 2016-06-20 LAB — CBC
HCT: 46.1 % (ref 39.0–52.0)
HEMOGLOBIN: 15.5 g/dL (ref 13.0–17.0)
MCH: 29.5 pg (ref 26.0–34.0)
MCHC: 33.6 g/dL (ref 30.0–36.0)
MCV: 87.6 fL (ref 78.0–100.0)
PLATELETS: 290 10*3/uL (ref 150–400)
RBC: 5.26 MIL/uL (ref 4.22–5.81)
RDW: 13.1 % (ref 11.5–15.5)
WBC: 11.7 10*3/uL — ABNORMAL HIGH (ref 4.0–10.5)

## 2016-06-20 LAB — BASIC METABOLIC PANEL
Anion gap: 10 (ref 5–15)
BUN: 16 mg/dL (ref 6–20)
CHLORIDE: 105 mmol/L (ref 101–111)
CO2: 23 mmol/L (ref 22–32)
CREATININE: 1.2 mg/dL (ref 0.61–1.24)
Calcium: 9.3 mg/dL (ref 8.9–10.3)
GFR calc Af Amer: >60 mL/min (ref >60)
Glucose, Bld: 155 mg/dL — ABNORMAL HIGH (ref 65–99)
POTASSIUM: 3.9 mmol/L (ref 3.5–5.1)
SODIUM: 138 mmol/L (ref 135–145)

## 2016-06-20 MED ORDER — ONDANSETRON HCL 4 MG/2ML IJ SOLN
4.0000 mg | Freq: Once | INTRAMUSCULAR | Status: AC
  Administered 2016-06-20: 4 mg via INTRAVENOUS
  Filled 2016-06-20: qty 2

## 2016-06-20 MED ORDER — OXYCODONE-ACETAMINOPHEN 5-325 MG PO TABS: 1.0000 | ORAL_TABLET | ORAL | 0 refills | Status: DC | PRN

## 2016-06-20 MED ORDER — TAMSULOSIN HCL 0.4 MG PO CAPS: 0.4000 mg | ORAL_CAPSULE | Freq: Every day | ORAL | 0 refills | Status: DC

## 2016-06-20 MED ORDER — HYDROMORPHONE HCL 1 MG/ML IJ SOLN
1.0000 mg | Freq: Once | INTRAMUSCULAR | Status: AC
  Administered 2016-06-20: 1 mg via INTRAVENOUS
  Filled 2016-06-20: qty 1

## 2016-06-20 MED ORDER — IBUPROFEN 400 MG PO TABS: 400.0000 mg | ORAL_TABLET | Freq: Three times a day (TID) | ORAL | 0 refills | Status: DC | PRN

## 2016-06-20 MED ORDER — ONDANSETRON 8 MG PO TBDP: 8.0000 mg | ORAL_TABLET | Freq: Three times a day (TID) | ORAL | 0 refills | Status: DC | PRN

## 2016-06-20 MED ORDER — KETOROLAC TROMETHAMINE 30 MG/ML IJ SOLN
30.0000 mg | Freq: Once | INTRAMUSCULAR | Status: AC
  Administered 2016-06-20: 30 mg via INTRAVENOUS
  Filled 2016-06-20: qty 1

## 2016-06-20 NOTE — ED Triage Notes (Signed)
Pt was diagnosed with UTI within last two weeks and the same burning and urgency sensation came back tonight. Pt reports completing antibiotic course.

## 2016-06-20 NOTE — ED Notes (Signed)
Pt states understanding of care given and follow up instructions.  Pt a/o ambulated with steady gait from ED with SO to transport home.

## 2016-06-20 NOTE — ED Provider Notes (Signed)
[facility] DEPT Provider Note   CSN: [id number] Arrival date & time: 06/20/16  0441     History   Chief Complaint Chief Complaint  Patient presents with  . Urinary Urgency    HPI Tony Wilson is a 36 y.o. male.  HPI Patient presents to the emergency department complaints of urinary urgency and severe right groin and right flank pain which began this morning.  He's been nauseated.  He's never had pain like this before.  He reports his pain comes in waves.  No prior history kidney stones.  No dysuria.  No fevers or chills.  Symptoms are severe in severity.  No other complaints   Past Medical History:  Diagnosis Date  . UTI (urinary tract infection)     Patient Active Problem List   Diagnosis Date Noted  . Left knee pain 01/12/2014  . Lumbago 06/09/2012  . CLOSED FRACTURE OF NAVICULAR BONE OF WRIST 03/07/2009    History reviewed. No pertinent surgical history.     Home Medications    Prior to Admission medications   Medication Sig Start Date End Date Taking? Authorizing Provider  gabapentin (NEURONTIN) 100 MG capsule Take 1 capsule (100 mg total) by mouth 3 (three) times daily. 01/12/14   Vickki Hearing, MD  ibuprofen (ADVIL,MOTRIN) 400 MG tablet Take 1 tablet (400 mg total) by mouth every 8 (eight) hours as needed. 06/20/16   Azalia Bilis, MD  ondansetron (ZOFRAN ODT) 8 MG disintegrating tablet Take 1 tablet (8 mg total) by mouth every 8 (eight) hours as needed for nausea or vomiting. 06/20/16   Azalia Bilis, MD  oxyCODONE-acetaminophen (PERCOCET/ROXICET) 5-325 MG tablet Take 1 tablet by mouth every 4 (four) hours as needed for severe pain. 06/20/16   Azalia Bilis, MD  tamsulosin (FLOMAX) 0.4 MG CAPS capsule Take 1 capsule (0.4 mg total) by mouth daily. 06/20/16   Azalia Bilis, MD    Family History Family History  Problem Relation Age of Onset  . Cancer Mother     Social History Social History  Substance Use Topics  . Smoking status: Former Smoker   Types: Cigarettes  . Smokeless tobacco: Never Used  . Alcohol use No     Allergies   Penicillins   Review of Systems Review of Systems  All other systems reviewed and are negative.    Physical Exam Updated Vital Signs BP (!) 152/93 (BP Location: Left Arm)   Pulse 62   Temp 97.6 F (36.4 C) (Oral)   Resp 20   Ht  (1.88 m)   Wt 245 lb (111.1 kg)   SpO2 100%   BMI 31.46 kg/m   Physical Exam  Constitutional: He is oriented to person, place, and time. He appears well-developed and well-nourished. He appears distressed.  Uncomfortable appearing.  Pacing the room  HENT:  Head: Normocephalic and atraumatic.  Eyes: EOM are normal.  Neck: Normal range of motion.  Cardiovascular: Normal rate, regular rhythm, normal heart sounds and intact distal pulses.   Pulmonary/Chest: Effort normal and breath sounds normal. No respiratory distress.  Abdominal: Soft. He exhibits no distension. There is no tenderness.  Musculoskeletal: Normal range of motion.  Neurological: He is alert and oriented to person, place, and time.  Skin: Skin is warm and dry.  Psychiatric: He has a normal mood and affect. Judgment normal.  Nursing note and vitals reviewed.    ED Treatments / Results  Labs (all labs ordered are listed, but only abnormal results are displayed) Labs Reviewed  CBC - Abnormal; Notable for the following:       Result Value   WBC 11.7 (*)    All other components within normal limits  BASIC METABOLIC PANEL - Abnormal; Notable for the following:    Glucose, Bld 155 (*)    All other components within normal limits  URINALYSIS, ROUTINE W REFLEX MICROSCOPIC    EKG  EKG Interpretation None       Radiology Ct Renal Stone Study  Result Date: 06/20/2016 CLINICAL DATA:  36 year old male with right groin pain. EXAM: CT ABDOMEN AND PELVIS WITHOUT CONTRAST TECHNIQUE: Multidetector CT imaging of the abdomen and pelvis was performed following the standard protocol without IV  contrast. COMPARISON:  None. FINDINGS: Evaluation of this exam is limited in the absence of intravenous contrast. Lower chest: The visualized lung bases are clear. No intra-abdominal free air or free fluid. Hepatobiliary: Diffuse fatty infiltration of the liver. No intrahepatic biliary ductal dilatation. The gallbladder is unremarkable. Pancreas: Unremarkable. No pancreatic ductal dilatation or surrounding inflammatory changes. Spleen: Normal in size without focal abnormality. Adrenals/Urinary Tract: The adrenal glands are unremarkable. There is a 4 mm stone at the right ureterovesical junction with mild right hydronephrosis. A 14 mm partially exophytic right renal hypodense lesion is not well characterized but likely represents a cyst. The left kidney is unremarkable. The urinary bladder is predominantly collapsed. Stomach/Bowel: Stomach is within normal limits. Appendix appears normal. No evidence of bowel wall thickening, distention, or inflammatory changes. Vascular/Lymphatic: The abdominal aorta and IVC are grossly unremarkable on this noncontrast study. No portal venous gas identified. There is no adenopathy. Reproductive: The prostate and seminal vesicles are grossly unremarkable. Other: Small fat containing left inguinal and umbilical hernias. Musculoskeletal: No acute or significant osseous findings. IMPRESSION: 1. A 4 mm right UVJ stone with mild right hydronephrosis. 2. Fatty liver. 3. No bowel obstruction or active inflammation.  Normal appendix. Electronically Signed   By: Elgie Collard M.D.   On: 06/20/2016 05:41    Procedures Procedures (including critical care time)  Medications Ordered in ED Medications  HYDROmorphone (DILAUDID) injection 1 mg (1 mg Intravenous Given 06/20/16 0519)  ketorolac (TORADOL) 30 MG/ML injection 30 mg (30 mg Intravenous Given 06/20/16 0519)  ondansetron (ZOFRAN) injection 4 mg (4 mg Intravenous Given 06/20/16 0519)     Initial Impression / Assessment and Plan  / ED Course  I have reviewed the triage vital signs and the nursing notes.  Pertinent labs & imaging results that were available during my care of the patient were reviewed by me and considered in my medical decision making (see chart for details).     Distal right ureteral stone.  Symptoms today are consistent with ureteral colic.  Pain improved.  Discharge home in good condition.  Outpatient urology follow-up.  Standard stone precautions given  Final Clinical Impressions(s) / ED Diagnoses   Final diagnoses:  Right ureteral stone    New Prescriptions New Prescriptions   IBUPROFEN (ADVIL,MOTRIN) 400 MG TABLET    Take 1 tablet (400 mg total) by mouth every 8 (eight) hours as needed.   ONDANSETRON (ZOFRAN ODT) 8 MG DISINTEGRATING TABLET    Take 1 tablet (8 mg total) by mouth every 8 (eight) hours as needed for nausea or vomiting.   OXYCODONE-ACETAMINOPHEN (PERCOCET/ROXICET) 5-325 MG TABLET    Take 1 tablet by mouth every 4 (four) hours as needed for severe pain.   TAMSULOSIN (FLOMAX) 0.4 MG CAPS CAPSULE    Take 1 capsule (0.4 mg total) by  mouth daily.     Azalia Bilis, MD 06/20/16 [phone]

## 2016-07-02 MED FILL — GABAPENTIN 100 MG CAPSULE: 100 | 30 days supply | Qty: 90 | Fill #2

## 2016-08-15 DIAGNOSIS — G894 Chronic pain syndrome: Secondary | ICD-10-CM | POA: Diagnosis not present

## 2016-08-15 DIAGNOSIS — I10 Essential (primary) hypertension: Secondary | ICD-10-CM | POA: Diagnosis not present

## 2016-08-15 DIAGNOSIS — Z1389 Encounter for screening for other disorder: Secondary | ICD-10-CM | POA: Diagnosis not present

## 2016-08-15 DIAGNOSIS — F17290 Nicotine dependence, other tobacco product, uncomplicated: Secondary | ICD-10-CM | POA: Diagnosis not present

## 2016-08-15 DIAGNOSIS — Z6833 Body mass index (BMI) 33.0-33.9, adult: Secondary | ICD-10-CM | POA: Diagnosis not present

## 2016-08-15 MED FILL — LOSARTAN POTASSIUM 50 MG TA: 50 | 30 days supply | Qty: 30 | Fill #0

## 2016-08-17 MED FILL — GABAPENTIN 100 MG CAPSULE: 100 | 30 days supply | Qty: 90 | Fill #0

## 2016-08-17 MED FILL — diazePAM 10 MG TABS: 10 | 30 days supply | Qty: 30 | Fill #0

## 2016-08-17 MED FILL — HYDROCODON-APAP 10-325: 10-325 | 30 days supply | Qty: 120 | Fill #0

## 2016-09-07 MED FILL — DICLOFENAC SOD 75 MG TAB EC: 75 | 30 days supply | Qty: 60 | Fill #2

## 2016-09-20 MED FILL — LOSARTAN POTASSIUM 50 MG TA: 50 | 30 days supply | Qty: 30 | Fill #1

## 2016-10-09 MED FILL — HYDROCODON-APAP 10-325: 10-325 | 30 days supply | Qty: 120 | Fill #0

## 2016-10-10 ENCOUNTER — Telehealth: Payer: Self-pay | Admitting: Orthopaedic Surgery

## 2016-10-10 MED FILL — diazePAM 10 MG TABS: 10 | 30 days supply | Qty: 30 | Fill #1

## 2016-10-10 MED FILL — GABAPENTIN 100 MG CAP: 100 | 30 days supply | Qty: 90 | Fill #1 | Status: TO

## 2016-10-10 MED FILL — DICLOFENAC SOD 75 MG TAB EC: 75 | 30 days supply | Qty: 60 | Fill #0 | Status: TO

## 2016-10-22 MED FILL — LOSARTAN POTASSIUM 50 MG TA: 50 | 30 days supply | Qty: 30 | Fill #2 | Status: TO

## 2017-07-28 ENCOUNTER — Other Ambulatory Visit: Payer: Self-pay | Admitting: Orthopaedic Surgery

## 2017-07-30 NOTE — Telephone Encounter (Signed)
[facility]

## 2017-07-30 NOTE — Telephone Encounter (Signed)
[facility]

## 2018-10-21 ENCOUNTER — Other Ambulatory Visit: Payer: Self-pay | Admitting: Orthopaedic Surgery

## 2021-02-28 ENCOUNTER — Ambulatory Visit (HOSPITAL_COMMUNITY)
Admission: RE | Admit: 2021-02-28 | Discharge: 2021-02-28 | Disposition: A | Discharge status: Home / Self Care | Payer: PRIVATE HEALTH INSURANCE | Source: Ambulatory Visit | Attending: Family Medicine | Admitting: Family Medicine

## 2021-02-28 ENCOUNTER — Other Ambulatory Visit (HOSPITAL_COMMUNITY): Payer: Self-pay | Admitting: Family Medicine

## 2021-02-28 ENCOUNTER — Other Ambulatory Visit: Payer: Self-pay

## 2021-02-28 DIAGNOSIS — J209 Acute bronchitis, unspecified: Secondary | ICD-10-CM | POA: Insufficient documentation

## 2022-01-30 IMAGING — DX DG CHEST 2V
2 series · 2 of 2 positions shown · non-contrast
Comparison: Chest x-ray dated June 08, 2011.

CLINICAL DATA: Shortness of breath since last night.

EXAM:
CHEST - 2 VIEW

[chest lat (1 of 2)]
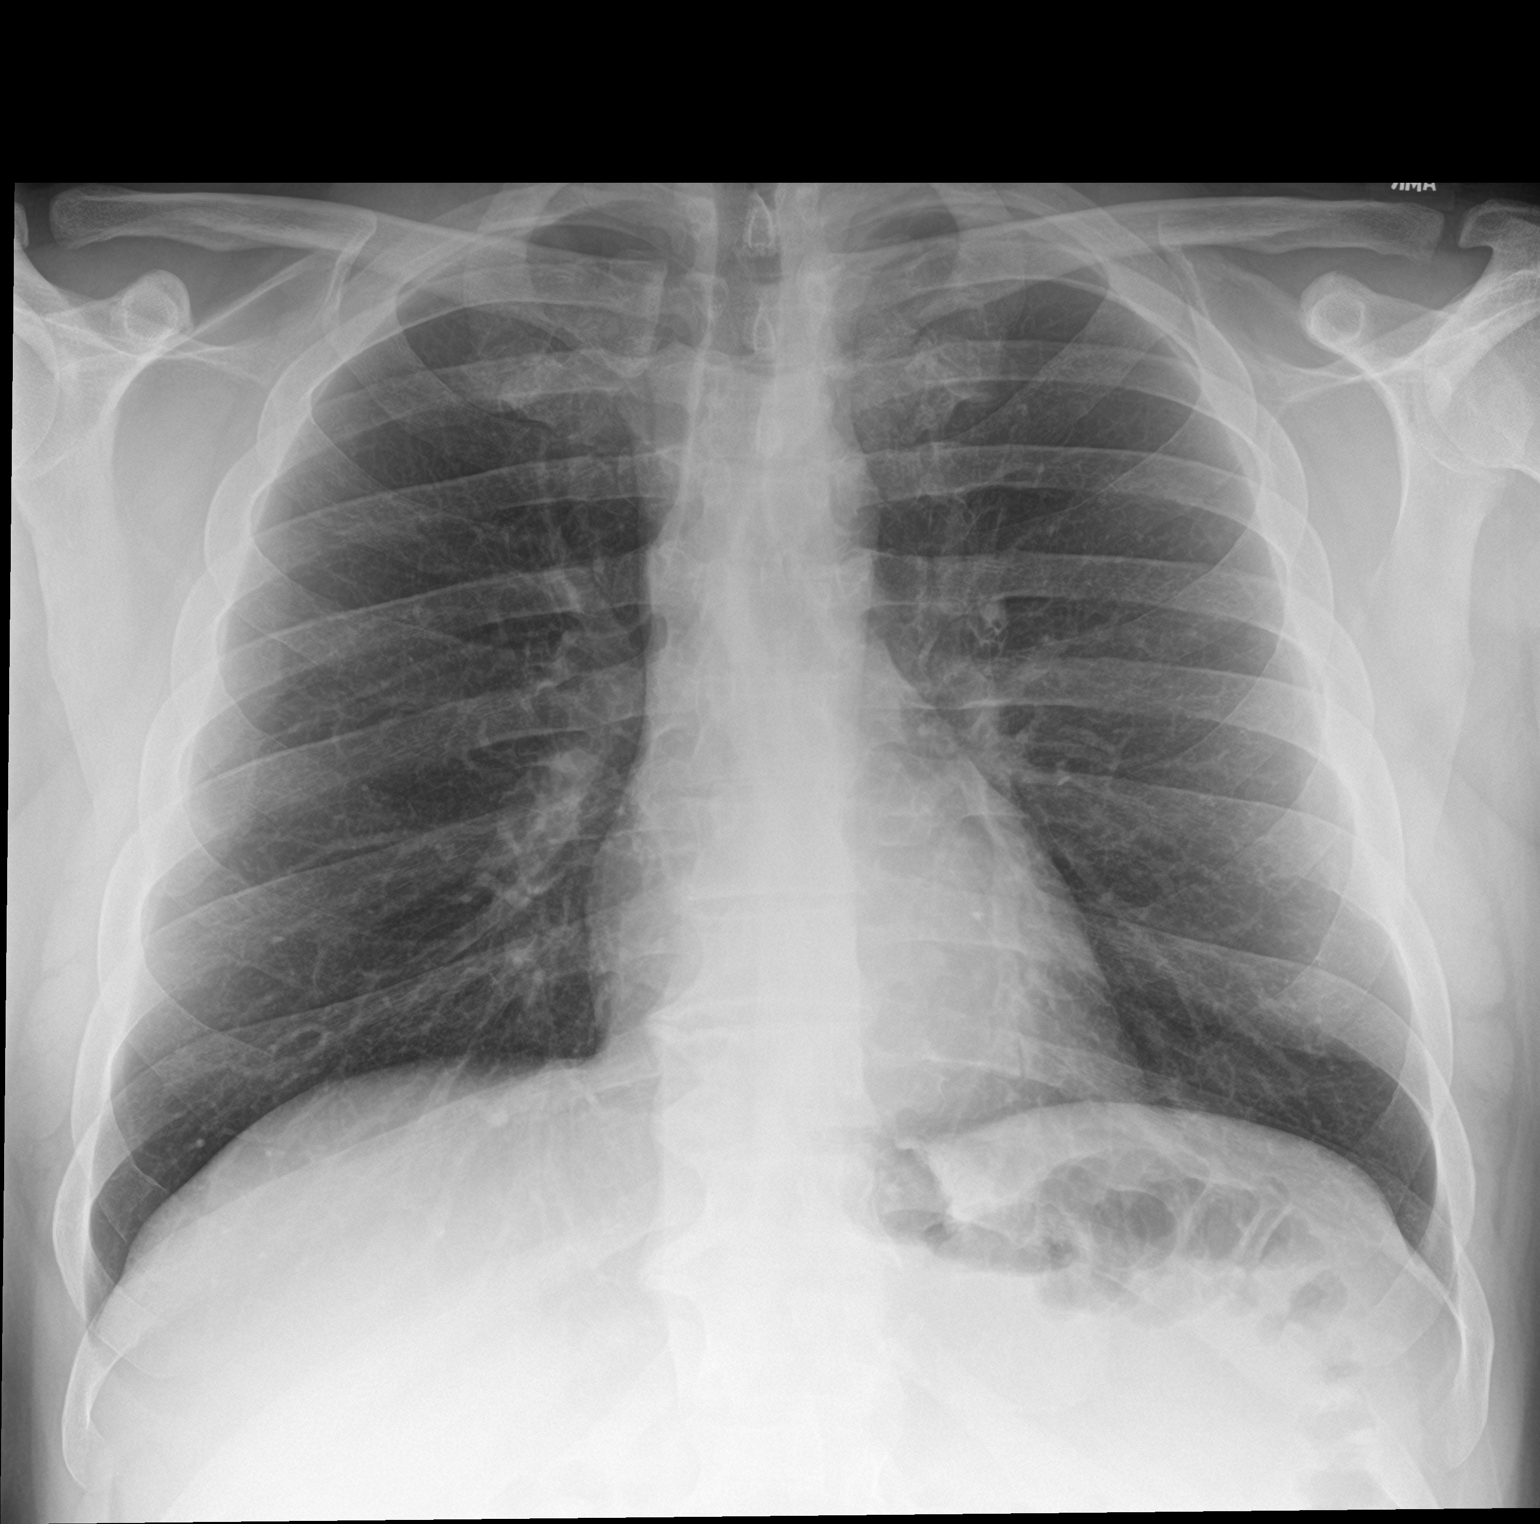

[chest lat (2 of 2)]
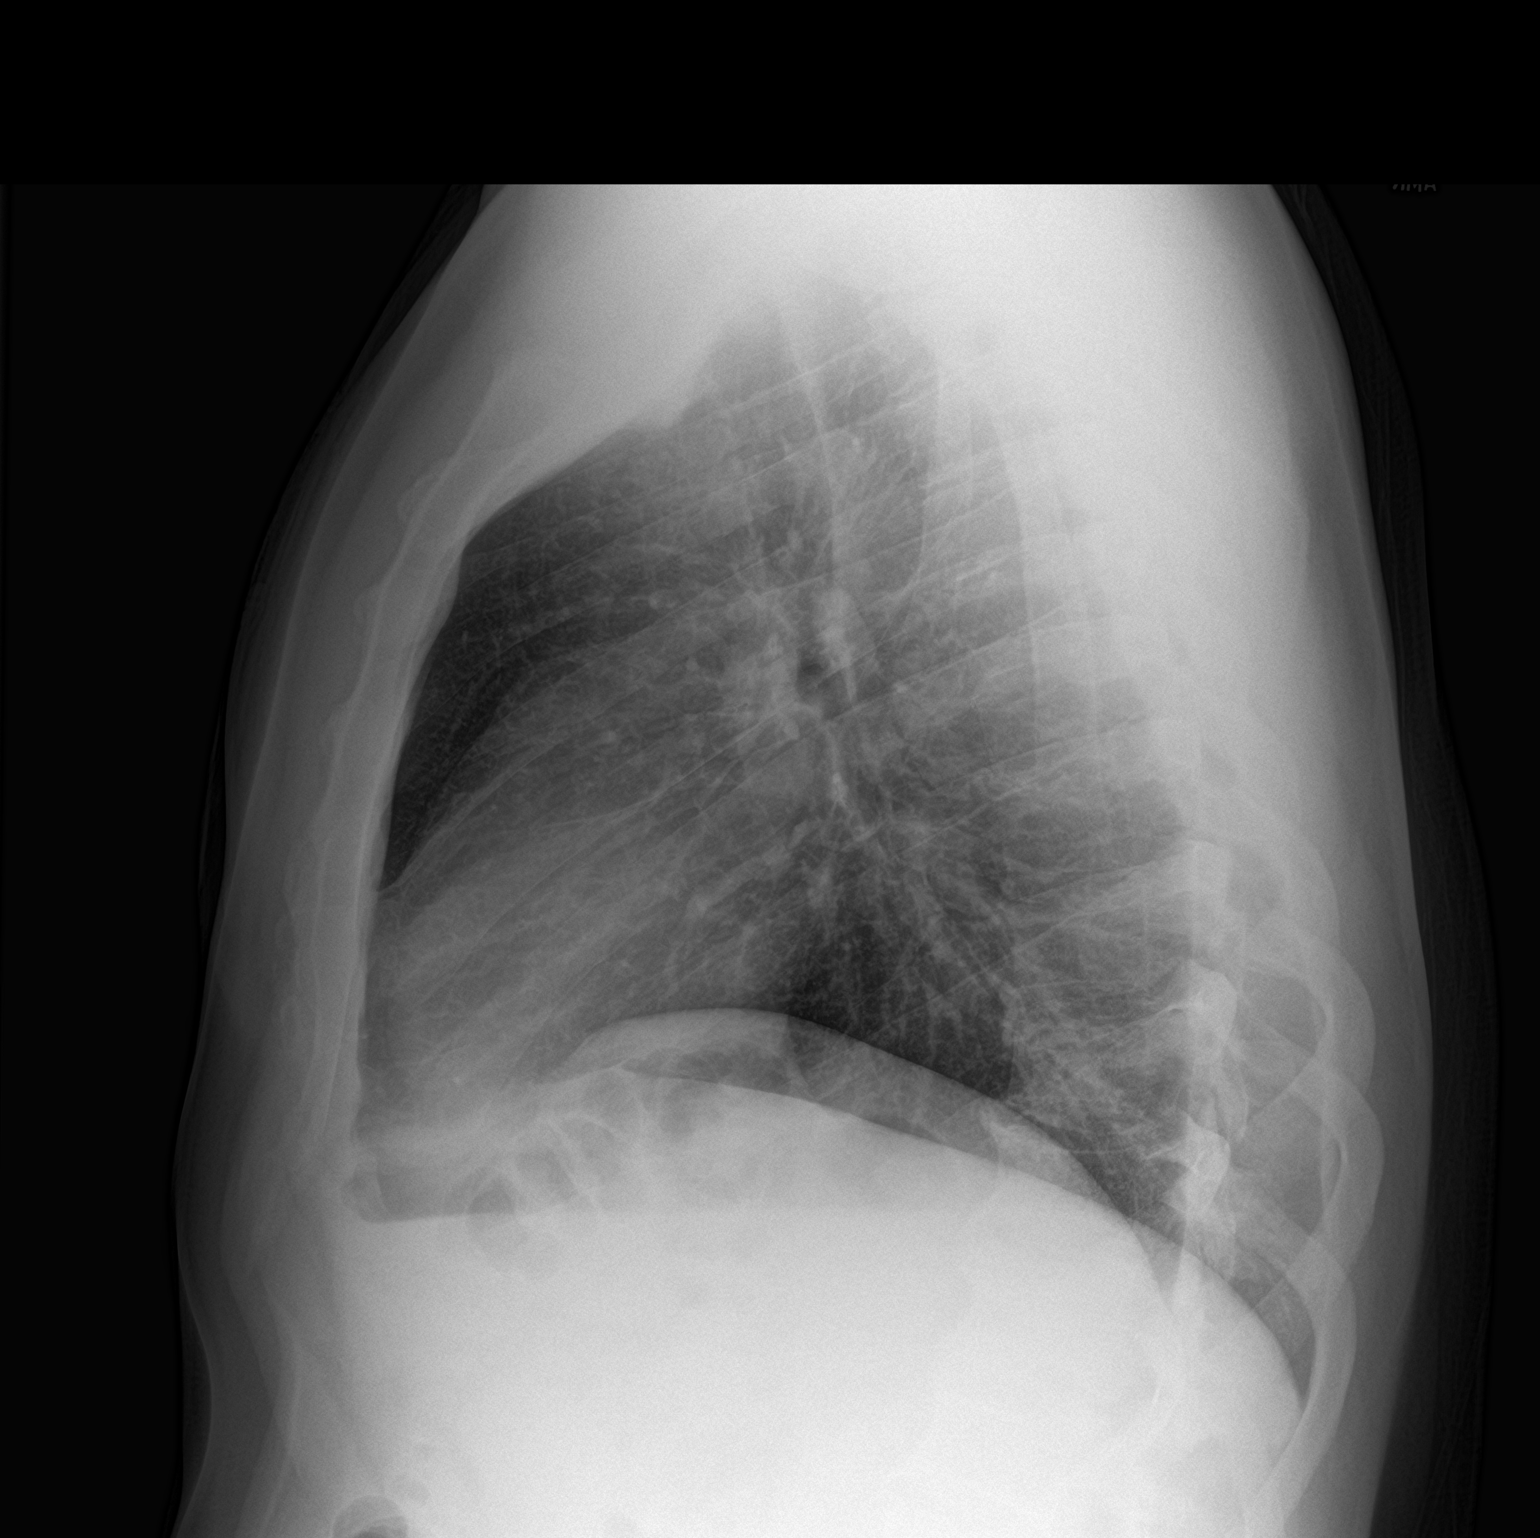

[2 of 2 positions shown; findings below may reference images not displayed]

FINDINGS: The heart size and mediastinal contours are within normal limits.
Both lungs are clear. The visualized skeletal structures are
unremarkable.
IMPRESSION: No active cardiopulmonary disease.

## 2022-07-17 ENCOUNTER — Encounter: Payer: Self-pay | Admitting: Orthopaedic Surgery

## 2022-07-17 ENCOUNTER — Ambulatory Visit: Payer: Commercial Managed Care - PPO | Admitting: Orthopaedic Surgery

## 2022-07-17 ENCOUNTER — Other Ambulatory Visit (INDEPENDENT_AMBULATORY_CARE_PROVIDER_SITE_OTHER): Payer: Commercial Managed Care - PPO

## 2022-07-17 VITALS — BP 145/104 | HR 101 | Ht 74.0 in | Wt 245.0 lb

## 2022-07-17 DIAGNOSIS — G8929 Other chronic pain: Secondary | ICD-10-CM

## 2022-07-17 DIAGNOSIS — M25512 Pain in left shoulder: Secondary | ICD-10-CM

## 2022-07-17 DIAGNOSIS — M542 Cervicalgia: Secondary | ICD-10-CM

## 2022-07-17 MED ORDER — PREDNISONE 10 MG (21) PO TBPK: ORAL_TABLET | ORAL | 1 refills | Status: DC

## 2022-07-17 NOTE — Progress Notes (Signed)
Subjective:    Patient ID: Tony Wilson, male    DOB: Jul 05, 1980, 42 y.o.   MRN: [id number]  HPI H has had pain of the left shoulder and upper back that radiates to the left hand, long and index fingers over the last month, getting worse over the last two weeks.  He has more pain during the day, he sleeps well.  Nothing helps it.  He has tried Advil, tylenol and heat, ice.  He is taking hydrocodone 10 for his lower back from Dr. Sherwood Gambler.  That does not help.  He is getting worse.  He has some weakness on the left arm.  He is right hand dominant.  He has no trauma, no redness, no swelling of the hand or arm or shoulder.   Review of Systems  Constitutional:  Positive for activity change.  Musculoskeletal:  Positive for arthralgias, back pain, myalgias and neck pain.  All other systems reviewed and are negative. For Review of Systems, all other systems reviewed and are negative.  The following is a summary of the past history medically, past history surgically, known current medicines, social history and family history.  This information is gathered electronically by the computer from prior information and documentation.  I review this each visit and have found including this information at this point in the chart is beneficial and informative.   Past Medical History:  Diagnosis Date   UTI (urinary tract infection)     History reviewed. No pertinent surgical history.  Current Outpatient Medications on File Prior to Visit  Medication Sig Dispense Refill   atorvastatin (LIPITOR) 20 MG tablet Take 20 mg by mouth daily.     diclofenac (VOLTAREN) 75 MG EC tablet TAKE 1 TABLET BY MOUTH TWICE DAILY WITH FOOD 180 tablet 3   gabapentin (NEURONTIN) 100 MG capsule Take 1 capsule (100 mg total) by mouth 3 (three) times daily. 60 capsule 0   HYDROcodone-acetaminophen (NORCO) 10-325 MG tablet Take 1 tablet by mouth every 6 (six) hours as needed.     losartan (COZAAR) 50 MG tablet Take 50 mg by mouth  daily.     ondansetron (ZOFRAN ODT) 8 MG disintegrating tablet Take 1 tablet (8 mg total) by mouth every 8 (eight) hours as needed for nausea or vomiting. 10 tablet 0   No current facility-administered medications on file prior to visit.    Social History   Socioeconomic History   Marital status: Married    Spouse name: Not on file   Number of children: Not on file   Years of education: Not on file   Highest education level: Not on file  Occupational History   Not on file  Tobacco Use   Smoking status: Former    Types: Cigarettes   Smokeless tobacco: Never  Substance and Sexual Activity   Alcohol use: No   Drug use: No   Sexual activity: Not on file  Other Topics Concern   Not on file  Social History Narrative   Not on file   Social Determinants of Health   Financial Resource Strain: Not on file  Food Insecurity: Not on file  Transportation Needs: Not on file  Physical Activity: Not on file  Stress: Not on file  Social Connections: Not on file  Intimate Partner Violence: Not on file    Family History  Problem Relation Age of Onset   Cancer Mother     BP (!) 145/104   Pulse (!) 101   Ht 6'  2" (1.88 m)   Wt 245 lb (111.1 kg)   BMI 31.46 kg/m   Body mass index is 31.46 kg/m.      Objective:   Physical Exam Vitals and nursing note reviewed. Exam conducted with a chaperone present.  Constitutional:      Appearance: He is well-developed.  HENT:     Head: Normocephalic and atraumatic.  Eyes:     Conjunctiva/sclera: Conjunctivae normal.     Pupils: Pupils are equal, round, and reactive to light.  Cardiovascular:     Rate and Rhythm: Normal rate and regular rhythm.  Pulmonary:     Effort: Pulmonary effort is normal.  Abdominal:     Palpations: Abdomen is soft.  Musculoskeletal:       Arms:     Cervical back: Normal range of motion and neck supple.  Skin:    General: Skin is warm and dry.  Neurological:     Mental Status: He is alert and oriented  to person, place, and time.     Cranial Nerves: No cranial nerve deficit.     Motor: No abnormal muscle tone.     Coordination: Coordination normal.     Deep Tendon Reflexes: Reflexes are normal and symmetric. Reflexes normal.  Psychiatric:        Behavior: Behavior normal.        Thought Content: Thought content normal.        Judgment: Judgment normal.   X-rays were done of the cervical spine and shoulder, reported separately.        Assessment & Plan:   Encounter Diagnoses  Name Primary?   Neck pain Yes   Chronic left shoulder pain    Cervicalgia    I am concerned about paresthesias to the hand and fingers, weakness of left triceps and grip.  I am concerned about HNP in neck.  I will get MRI of the cervical spine.  I will begin prednisone dose pack.  He is to continue his pain medicine from Dr. Sherwood Gambler.  Return here in two weeks or earlier if the MRI is done earlier.  Call if any problem.  Precautions discussed.  Electronically Signed Darreld Mclean, MD 5/21/20249:24 AM

## 2022-08-17 ENCOUNTER — Ambulatory Visit (HOSPITAL_COMMUNITY): Payer: Commercial Managed Care - PPO

## 2022-08-21 ENCOUNTER — Ambulatory Visit: Payer: Commercial Managed Care - PPO | Admitting: Orthopaedic Surgery

## 2022-09-12 ENCOUNTER — Ambulatory Visit (HOSPITAL_COMMUNITY): Payer: Commercial Managed Care - PPO

## 2022-09-12 ENCOUNTER — Encounter (HOSPITAL_COMMUNITY): Payer: Self-pay

## 2022-09-14 ENCOUNTER — Ambulatory Visit: Payer: Commercial Managed Care - PPO

## 2023-03-26 ENCOUNTER — Encounter: Payer: Self-pay | Admitting: Internal Medicine

## 2023-04-10 ENCOUNTER — Encounter (INDEPENDENT_AMBULATORY_CARE_PROVIDER_SITE_OTHER): Payer: Self-pay | Admitting: *Deleted

## 2023-04-22 ENCOUNTER — Encounter (INDEPENDENT_AMBULATORY_CARE_PROVIDER_SITE_OTHER): Payer: Self-pay | Admitting: *Deleted

## 2023-05-22 ENCOUNTER — Encounter: Payer: Self-pay | Admitting: Gastroenterology

## 2023-05-22 NOTE — Progress Notes (Unsigned)
 Referring Provider: Elfredia Nevins, MD Primary Care Physician:  Elfredia Nevins, MD Primary Gastroenterologist:  Dr. Jena Gauss  Chief Complaint  Patient presents with   Hemorrhoids    Hemorrhoids and constipation     HPI:   Tony Wilson is a 43 y.o. male presenting today at the request of Elfredia Nevins, MD for hemorrhoids.   Today:  Slight constipation starting 2 years ago and is getting worse. Having to take MiraLAX at least 3 times a week. Has a BM every day at the same time even when constipated, but stools become harder.  MiraLAX works well when he takes it. No abdominal pain. He does report about twice a year or less he has had sudden onset pain with development of a bulge in his LUQ or RUQ region that will last about 1-2 minues and resolve. Happens when bending over, but bends over all the time, its just random when this has happened. Pain is very intense, but totally goes away after 1-2 minutes. Feels sort of like a charley horse.   Has been taking hydrocodone since 2008 or 2009. Takes 1-2 times a day. This is for chronic back pain.   Drinks 32oz of water daily, sometimes more.  Consumes a lot of vegetables.   Hemorrhoids flare when constipation flares. Associated rectal bleeding at times. Just scant amount on toilet tissue. No pain. Doesn't use anything for hemorrhoids. Just takes Miralax and hemorrhoids resolve when constipation resolves.    Past Medical History:  Diagnosis Date   Asthma    HLD (hyperlipidemia)    HTN (hypertension)    UTI (urinary tract infection)     History reviewed. No pertinent surgical history.  Current Outpatient Medications  Medication Sig Dispense Refill   albuterol (VENTOLIN HFA) 108 (90 Base) MCG/ACT inhaler Inhale into the lungs.     atorvastatin (LIPITOR) 20 MG tablet Take 20 mg by mouth daily.     diclofenac (VOLTAREN) 75 MG EC tablet TAKE 1 TABLET BY MOUTH TWICE DAILY WITH FOOD 180 tablet 3   gabapentin (NEURONTIN) 100 MG capsule  Take 1 capsule (100 mg total) by mouth 3 (three) times daily. 60 capsule 0   HYDROcodone-acetaminophen (NORCO) 10-325 MG tablet Take 1 tablet by mouth every 6 (six) hours as needed.     hydrocortisone (ANUSOL-HC) 2.5 % rectal cream Place 1 Application rectally 2 (two) times daily. 30 g 1   losartan (COZAAR) 50 MG tablet Take 50 mg by mouth daily.     tiZANidine (ZANAFLEX) 4 MG tablet Take by mouth.     No current facility-administered medications for this visit.    Allergies as of 05/23/2023 - Review Complete 05/23/2023  Allergen Reaction Noted   Penicillins  06/01/2016    Family History  Problem Relation Age of Onset   Cancer Mother     Social History   Socioeconomic History   Marital status: Married    Spouse name: Not on file   Number of children: Not on file   Years of education: Not on file   Highest education level: Not on file  Occupational History   Not on file  Tobacco Use   Smoking status: Former    Types: Cigarettes   Smokeless tobacco: Never  Substance and Sexual Activity   Alcohol use: No   Drug use: No   Sexual activity: Not on file  Other Topics Concern   Not on file  Social History Narrative   Not on file   Social Drivers of  Health   Financial Resource Strain: Not on file  Food Insecurity: Not on file  Transportation Needs: Not on file  Physical Activity: Not on file  Stress: Not on file  Social Connections: Not on file  Intimate Partner Violence: Not on file    Review of Systems: Gen: Denies any fever, chills, cold or flu like symptoms, pre-syncope, or syncope.  CV: Denies chest pain, heart palpitations, peripheral edema, syncope.  Resp: Denies shortness of breath, cough.  GI: See HPI GU : Denies urinary burning, urinary frequency, urinary hesitancy.  MS: Admits to back pain.  Derm: Denies rash. Psych: Denies depression, anxiety. Heme: See HPI  Physical Exam: BP (!) 148/88 (BP Location: Left Arm, Patient Position: Sitting)   Pulse 96    Temp 98.6 F (37 C) (Temporal)   Ht 6\' 2"  (1.88 m)   Wt 238 lb 12.8 oz (108.3 kg)   BMI 30.66 kg/m  General:   Alert and oriented. Pleasant and cooperative. Well-nourished and well-developed.  Head:  Normocephalic and atraumatic. Eyes:  Without icterus, sclera clear and conjunctiva pink.  Ears:  Normal auditory acuity. Lungs:  Clear to auscultation bilaterally. No wheezes, rales, or rhonchi. No distress.  Heart:  S1, S2 present without murmurs appreciated.  Abdomen:  +BS, soft, non-tender and non-distended. No HSM noted. No guarding or rebound. No masses appreciated.  Rectal:  Declined.  Msk:  Symmetrical without gross deformities. Normal posture. Extremities:  Without edema. Neurologic:  Alert and  oriented x4;  grossly normal neurologically. Skin:  Intact without significant lesions or rashes. Psych:  Normal mood and affect.    Assessment:  43 year old male with history of HTN, HLD, asthma, presenting today for further evaluation of constipation and hemorrhoids.   Constipation: New onset over the last couple of years with slow progression. Symptoms respond well to MiraLAX. Occasional scant toilet tissue hematochezia only when having a hemorrhoid flare related to constipation. Otherwise, no concerning symptoms. Discussed possible colonoscopy, but will hold off for now until age 66 for screening purposes which will be in about 3 months.   Hemorrhoids/rectal bleeding:  Reports 1 small hemorrhoid that flares when he has constipation. Will have scant toilet tissue hematochezia with this. No significant pain and symptoms resolve with Miralax for constipation. Discussed the possibility of hemorrhoid banding, the patient would like to manage conservatively for now.  Additionally, patient declined rectal exam today as he reports there are no problem as long as he takes MiraLAX. States if they become more of a problem, he will let me know and be agreeable to a rectal exam.  Upper abdominal  pain:  Rare intermittent LUQ or RUQ pain with associated hump in the area that resolves in 1-2 minutes. Considering this is only occurring 1-2 times a year and is with activity, I suspect this is MSK etiology. No hernia or abdominal wall defect noted on exam today.    Plan:  MiraLAX 17 g daily. Anusol rectal cream twice daily x 5 days as needed Limit toilet time to 2-3 minutes. Avoid straining. Follow-up in 3 months.  Will discuss scheduling screening colonoscopy at that time.   Ermalinda Memos, PA-C [facility] 05/23/2023

## 2023-05-23 ENCOUNTER — Encounter: Payer: Self-pay | Admitting: Gastroenterology

## 2023-05-23 ENCOUNTER — Ambulatory Visit: Admitting: Gastroenterology

## 2023-05-23 VITALS — BP 148/88 | HR 96 | Temp 98.6°F | Ht 74.0 in | Wt 238.8 lb

## 2023-05-23 DIAGNOSIS — R1010 Upper abdominal pain, unspecified: Secondary | ICD-10-CM

## 2023-05-23 DIAGNOSIS — K921 Melena: Secondary | ICD-10-CM | POA: Diagnosis not present

## 2023-05-23 DIAGNOSIS — K5900 Constipation, unspecified: Secondary | ICD-10-CM | POA: Diagnosis not present

## 2023-05-23 DIAGNOSIS — K649 Unspecified hemorrhoids: Secondary | ICD-10-CM | POA: Diagnosis not present

## 2023-05-23 MED ORDER — HYDROCORTISONE (PERIANAL) 2.5 % EX CREA: 1.0000 | TOPICAL_CREAM | Freq: Two times a day (BID) | CUTANEOUS | 1 refills | Status: AC

## 2023-05-23 NOTE — Patient Instructions (Signed)
 Take MiraLAX 17 g (1 capful) daily in 8 ounces of water or other noncarbonated beverage of your choice.  You can decrease MiraLAX to one half capful daily or every other day if every day dosing is too much.  I am sending in a rectal cream for you to use as needed for your hemorrhoids.  You will apply it twice a day for 5 days, then again as needed if your hemorrhoids flare.  Limit toilet time to 2-3 minutes.  Avoid straining.  Drink at least 64 ounces of water daily.  Consume plenty of fruits, vegetables, whole grains to maintain adequate fiber intake.   I will plan to see back in 3 months or sooner if needed.  It was good to meet you today!  Ermalinda Memos, PA-C [facility]

## 2023-05-24 ENCOUNTER — Encounter: Payer: Self-pay | Admitting: Gastroenterology

## 2023-07-04 ENCOUNTER — Encounter: Payer: Self-pay | Admitting: Gastroenterology

## 2023-10-18 ENCOUNTER — Encounter: Payer: Self-pay | Admitting: Radiology

## 2023-12-16 ENCOUNTER — Ambulatory Visit: Admitting: Student in an Organized Health Care Education/Training Program

## 2023-12-30 ENCOUNTER — Encounter: Payer: Self-pay | Admitting: Radiology

## 2024-01-27 ENCOUNTER — Ambulatory Visit: Admitting: Family Medicine

## 2024-02-24 ENCOUNTER — Ambulatory Visit: Payer: Self-pay | Admitting: Nurse Practitioner
